# Patient Record
Sex: Female | Born: 1985
Health system: Southern US, Community
[De-identification: ages and names within clinical notes are randomized; demographics above are authoritative.]

---

## 2006-03-30 ENCOUNTER — Emergency Department (HOSPITAL_COMMUNITY): Admission: EM | Admit: 2006-03-30 | Discharge: 2006-03-30 | Payer: Self-pay | Admitting: Emergency Medicine

## 2006-04-05 ENCOUNTER — Emergency Department (HOSPITAL_COMMUNITY): Admission: EM | Admit: 2006-04-05 | Discharge: 2006-04-05 | Payer: Self-pay | Admitting: Emergency Medicine

## 2007-02-21 ENCOUNTER — Other Ambulatory Visit: Admission: RE | Admit: 2007-02-21 | Discharge: 2007-02-21 | Payer: Self-pay | Admitting: Family Medicine

## 2008-05-15 ENCOUNTER — Other Ambulatory Visit: Admission: RE | Admit: 2008-05-15 | Discharge: 2008-05-15 | Payer: Self-pay | Admitting: Obstetrics and Gynecology

## 2008-12-13 ENCOUNTER — Inpatient Hospital Stay (HOSPITAL_COMMUNITY): Admission: AD | Admit: 2008-12-13 | Discharge: 2008-12-15 | Payer: Self-pay | Admitting: Obstetrics and Gynecology

## 2010-01-29 ENCOUNTER — Other Ambulatory Visit
Admission: RE | Admit: 2010-01-29 | Discharge: 2010-01-29 | Payer: Self-pay | Source: Home / Self Care | Admitting: Obstetrics and Gynecology

## 2010-04-13 LAB — COMPREHENSIVE METABOLIC PANEL
ALT: 26 U/L (ref 0–35)
Albumin: 2.9 g/dL — ABNORMAL LOW (ref 3.5–5.2)
BUN: 13 mg/dL (ref 6–23)
CO2: 20 mEq/L (ref 19–32)
Creatinine, Ser: 0.75 mg/dL (ref 0.4–1.2)
Glucose, Bld: 83 mg/dL (ref 70–99)
Total Bilirubin: 0.2 mg/dL — ABNORMAL LOW (ref 0.3–1.2)

## 2010-04-13 LAB — CBC
HCT: 37.9 % (ref 36.0–46.0)
Hemoglobin: 12.5 g/dL (ref 12.0–15.0)
MCV: 85.6 fL (ref 78.0–100.0)
Platelets: 174 10*3/uL (ref 150–400)
RDW: 14.4 % (ref 11.5–15.5)
WBC: 7.5 10*3/uL (ref 4.0–10.5)

## 2010-04-13 LAB — RPR: RPR Ser Ql: NONREACTIVE

## 2011-02-11 ENCOUNTER — Other Ambulatory Visit (HOSPITAL_COMMUNITY)
Admission: RE | Admit: 2011-02-11 | Discharge: 2011-02-11 | Disposition: A | Discharge status: Home / Self Care | Payer: Managed Care, Other (non HMO) | Source: Ambulatory Visit | Attending: Obstetrics and Gynecology | Admitting: Obstetrics and Gynecology

## 2011-02-11 DIAGNOSIS — Z113 Encounter for screening for infections with a predominantly sexual mode of transmission: Secondary | ICD-10-CM | POA: Insufficient documentation

## 2011-02-11 DIAGNOSIS — Z01419 Encounter for gynecological examination (general) (routine) without abnormal findings: Secondary | ICD-10-CM | POA: Insufficient documentation

## 2013-05-21 ENCOUNTER — Other Ambulatory Visit (HOSPITAL_COMMUNITY)
Admission: RE | Admit: 2013-05-21 | Discharge: 2013-05-21 | Disposition: A | Discharge status: Home / Self Care | Payer: BC Managed Care – PPO | Source: Ambulatory Visit | Attending: Nurse Practitioner | Admitting: Nurse Practitioner

## 2013-05-21 ENCOUNTER — Other Ambulatory Visit: Payer: Self-pay | Admitting: Nurse Practitioner

## 2013-05-21 DIAGNOSIS — Z113 Encounter for screening for infections with a predominantly sexual mode of transmission: Secondary | ICD-10-CM | POA: Insufficient documentation

## 2013-05-21 DIAGNOSIS — Z01419 Encounter for gynecological examination (general) (routine) without abnormal findings: Secondary | ICD-10-CM | POA: Insufficient documentation

## 2018-02-26 ENCOUNTER — Other Ambulatory Visit: Payer: Self-pay | Admitting: Obstetrics and Gynecology

## 2018-02-26 ENCOUNTER — Other Ambulatory Visit (HOSPITAL_COMMUNITY)
Admission: RE | Admit: 2018-02-26 | Discharge: 2018-02-26 | Disposition: A | Discharge status: Home / Self Care | Payer: BLUE CROSS/BLUE SHIELD | Source: Ambulatory Visit | Attending: Obstetrics and Gynecology | Admitting: Obstetrics and Gynecology

## 2018-02-26 DIAGNOSIS — Z113 Encounter for screening for infections with a predominantly sexual mode of transmission: Secondary | ICD-10-CM | POA: Diagnosis not present

## 2018-02-26 DIAGNOSIS — Z01419 Encounter for gynecological examination (general) (routine) without abnormal findings: Secondary | ICD-10-CM | POA: Insufficient documentation

## 2018-02-28 LAB — CYTOLOGY - PAP
Chlamydia: NEGATIVE
Diagnosis: NEGATIVE
HPV: NOT DETECTED
NEISSERIA GONORRHEA: NEGATIVE

## 2018-10-04 DIAGNOSIS — Z30432 Encounter for removal of intrauterine contraceptive device: Secondary | ICD-10-CM | POA: Diagnosis not present

## 2019-01-11 NOTE — L&D Delivery Note (Signed)
Delivery Note  [facility] DR Richardson Dopp pt    Patient Name: Gina Williamson DOB: 1985/03/01 MRN: [id number]  Date of admission: 12/06/2019 Delivering MD: Dale [place]  Date of delivery: 12/06/19 Type of delivery: SVD  Newborn Data: Live born female  Birth Weight:   APGAR: 9, 9  Newborn Delivery   Birth date/time: 12/06/2019 08:27:00 Delivery type: Vaginal, Spontaneous     Gina Williamson, 34 y.o., @ 38.4,  [id number], who was admitted for precipitous labor. I was called to the room when she progressed +3 station in the second stage of labor.  She pushed for 1/min.  She delivered a viable infant, cephalic and restituted to the LOA position over an intact perineum.  A nuchal cord   was not identified. The baby was placed on maternal abdomen while initial step of NRP were perfmored (Dry, Stimulated, and warmed). Hat placed on baby for thermoregulation. Delayed cord clamping was performed for 2 minutes.  Cord double clamped and cut.  Cord cut by father. Apgar scores were 9 and 9. Prophylactic Pitocin was started in the third stage of labor for active management. The placenta delivered spontaneously, shultz, with a 3 vessel cord and was sent to LD.  Inspection revealed none. An examination of the vaginal vault and cervix was free from lacerations. The uterus was firm, bleeding stable.   Placenta and umbilical artery blood gas were not sent.  There were no complications during the procedure.  Mom and baby skin to skin following delivery. Left in stable condition.  Maternal Info: Anesthesia: None Episiotomy: NO Lacerations:  No Suture Repair: No Est. Blood Loss (mL):   Newborn Info:  Baby Sex: female Circumcision: N/A  APGAR (1 MIN): 9   APGAR (5 MINS): 9   APGAR (10 MINS):     Mom to postpartum.  Baby to Couplet care / Skin to Skin.  DR Mora Appl updated   [place], CNM, NP-C .12/06/19 12:02 PM

## 2019-04-11 DIAGNOSIS — Z01419 Encounter for gynecological examination (general) (routine) without abnormal findings: Secondary | ICD-10-CM | POA: Diagnosis not present

## 2019-04-24 DIAGNOSIS — O26851 Spotting complicating pregnancy, first trimester: Secondary | ICD-10-CM | POA: Diagnosis not present

## 2019-05-02 DIAGNOSIS — Z3201 Encounter for pregnancy test, result positive: Secondary | ICD-10-CM | POA: Diagnosis not present

## 2019-05-02 DIAGNOSIS — Z3480 Encounter for supervision of other normal pregnancy, unspecified trimester: Secondary | ICD-10-CM | POA: Diagnosis not present

## 2019-05-02 LAB — OB RESULTS CONSOLE RUBELLA ANTIBODY, IGM
Rubella: IMMUNE
Rubella: NON-IMMUNE/NOT IMMUNE

## 2019-05-02 LAB — HEPATITIS C ANTIBODY: HCV Ab: NEGATIVE

## 2019-05-02 LAB — OB RESULTS CONSOLE HIV ANTIBODY (ROUTINE TESTING): HIV: NONREACTIVE

## 2019-05-02 LAB — OB RESULTS CONSOLE HEPATITIS B SURFACE ANTIGEN: Hepatitis B Surface Ag: NEGATIVE

## 2019-05-23 DIAGNOSIS — Z3490 Encounter for supervision of normal pregnancy, unspecified, unspecified trimester: Secondary | ICD-10-CM | POA: Diagnosis not present

## 2019-05-23 DIAGNOSIS — Z3481 Encounter for supervision of other normal pregnancy, first trimester: Secondary | ICD-10-CM | POA: Diagnosis not present

## 2019-05-23 LAB — OB RESULTS CONSOLE GC/CHLAMYDIA
Chlamydia: NEGATIVE
Gonorrhea: NEGATIVE

## 2019-07-18 DIAGNOSIS — Z36 Encounter for antenatal screening for chromosomal anomalies: Secondary | ICD-10-CM | POA: Diagnosis not present

## 2019-09-12 DIAGNOSIS — Z3480 Encounter for supervision of other normal pregnancy, unspecified trimester: Secondary | ICD-10-CM | POA: Diagnosis not present

## 2019-09-12 DIAGNOSIS — Z3482 Encounter for supervision of other normal pregnancy, second trimester: Secondary | ICD-10-CM | POA: Diagnosis not present

## 2019-09-12 LAB — OB RESULTS CONSOLE RPR: RPR: NONREACTIVE

## 2019-09-20 DIAGNOSIS — Z3480 Encounter for supervision of other normal pregnancy, unspecified trimester: Secondary | ICD-10-CM | POA: Diagnosis not present

## 2019-10-17 DIAGNOSIS — Z23 Encounter for immunization: Secondary | ICD-10-CM | POA: Diagnosis not present

## 2019-11-21 DIAGNOSIS — Z3493 Encounter for supervision of normal pregnancy, unspecified, third trimester: Secondary | ICD-10-CM | POA: Diagnosis not present

## 2019-11-29 DIAGNOSIS — R030 Elevated blood-pressure reading, without diagnosis of hypertension: Secondary | ICD-10-CM | POA: Diagnosis not present

## 2019-12-06 ENCOUNTER — Encounter (HOSPITAL_COMMUNITY): Payer: Self-pay | Admitting: *Deleted

## 2019-12-06 ENCOUNTER — Inpatient Hospital Stay (HOSPITAL_COMMUNITY)
Admission: AD | Admit: 2019-12-06 | Discharge: 2019-12-08 | DRG: 807 | Disposition: A | Discharge status: Home / Self Care | Payer: BC Managed Care – PPO | Attending: Obstetrics and Gynecology | Admitting: Obstetrics and Gynecology

## 2019-12-06 DIAGNOSIS — O99824 Streptococcus B carrier state complicating childbirth: Secondary | ICD-10-CM | POA: Diagnosis present

## 2019-12-06 DIAGNOSIS — O134 Gestational [pregnancy-induced] hypertension without significant proteinuria, complicating childbirth: Secondary | ICD-10-CM | POA: Diagnosis not present

## 2019-12-06 DIAGNOSIS — Z3A38 38 weeks gestation of pregnancy: Secondary | ICD-10-CM

## 2019-12-06 DIAGNOSIS — Z23 Encounter for immunization: Secondary | ICD-10-CM | POA: Diagnosis not present

## 2019-12-06 DIAGNOSIS — O26893 Other specified pregnancy related conditions, third trimester: Secondary | ICD-10-CM | POA: Diagnosis not present

## 2019-12-06 DIAGNOSIS — O99214 Obesity complicating childbirth: Secondary | ICD-10-CM | POA: Diagnosis present

## 2019-12-06 DIAGNOSIS — Z20822 Contact with and (suspected) exposure to covid-19: Secondary | ICD-10-CM | POA: Diagnosis present

## 2019-12-06 LAB — TYPE AND SCREEN
ABO/RH(D): O POS
Antibody Screen: NEGATIVE

## 2019-12-06 LAB — CBC
HCT: 39.2 % (ref 36.0–46.0)
Hemoglobin: 12.4 g/dL (ref 12.0–15.0)
MCH: 26 pg (ref 26.0–34.0)
MCHC: 31.6 g/dL (ref 30.0–36.0)
MCV: 82.2 fL (ref 80.0–100.0)
Platelets: 185 10*3/uL (ref 150–400)
RBC: 4.77 MIL/uL (ref 3.87–5.11)
RDW: 14.8 % (ref 11.5–15.5)
WBC: 10.5 10*3/uL (ref 4.0–10.5)
nRBC: 0 % (ref 0.0–0.2)

## 2019-12-06 LAB — RESP PANEL BY RT-PCR (FLU A&B, COVID) ARPGX2
Influenza A by PCR: NEGATIVE
Influenza B by PCR: NEGATIVE
SARS Coronavirus 2 by RT PCR: NEGATIVE

## 2019-12-06 MED ORDER — DIBUCAINE (PERIANAL) 1 % EX OINT: 1.0000 "application " | TOPICAL_OINTMENT | CUTANEOUS | Status: DC | PRN

## 2019-12-06 MED ORDER — DIPHENHYDRAMINE HCL 25 MG PO CAPS: 25.0000 mg | ORAL_CAPSULE | Freq: Four times a day (QID) | ORAL | Status: DC | PRN

## 2019-12-06 MED ORDER — LACTATED RINGERS IV SOLN: 500.0000 mL | INTRAVENOUS | Status: DC | PRN

## 2019-12-06 MED ORDER — OXYCODONE-ACETAMINOPHEN 5-325 MG PO TABS: 2.0000 | ORAL_TABLET | ORAL | Status: DC | PRN

## 2019-12-06 MED ORDER — OXYTOCIN 10 UNIT/ML IJ SOLN
INTRAMUSCULAR | Status: AC
  Filled 2019-12-06: qty 1

## 2019-12-06 MED ORDER — SOD CITRATE-CITRIC ACID 500-334 MG/5ML PO SOLN: 30.0000 mL | ORAL | Status: DC | PRN

## 2019-12-06 MED ORDER — SIMETHICONE 80 MG PO CHEW: 80.0000 mg | CHEWABLE_TABLET | ORAL | Status: DC | PRN

## 2019-12-06 MED ORDER — LIDOCAINE HCL (PF) 1 % IJ SOLN: 30.0000 mL | INTRAMUSCULAR | Status: DC | PRN

## 2019-12-06 MED ORDER — TETANUS-DIPHTH-ACELL PERTUSSIS 5-2.5-18.5 LF-MCG/0.5 IM SUSY: 0.5000 mL | PREFILLED_SYRINGE | Freq: Once | INTRAMUSCULAR | Status: DC

## 2019-12-06 MED ORDER — ONDANSETRON HCL 4 MG/2ML IJ SOLN: 4.0000 mg | Freq: Four times a day (QID) | INTRAMUSCULAR | Status: DC | PRN

## 2019-12-06 MED ORDER — IBUPROFEN 600 MG PO TABS
600.0000 mg | ORAL_TABLET | Freq: Four times a day (QID) | ORAL | Status: DC
  Administered 2019-12-06 – 2019-12-08 (×9): 600 mg via ORAL
  Filled 2019-12-06 (×9): qty 1

## 2019-12-06 MED ORDER — OXYCODONE-ACETAMINOPHEN 5-325 MG PO TABS: 1.0000 | ORAL_TABLET | ORAL | Status: DC | PRN

## 2019-12-06 MED ORDER — OXYTOCIN BOLUS FROM INFUSION: 333.0000 mL | Freq: Once | INTRAVENOUS | Status: DC

## 2019-12-06 MED ORDER — ACETAMINOPHEN 325 MG PO TABS
650.0000 mg | ORAL_TABLET | ORAL | Status: DC | PRN
  Administered 2019-12-07: 650 mg via ORAL
  Filled 2019-12-06: qty 2

## 2019-12-06 MED ORDER — ONDANSETRON HCL 4 MG PO TABS: 4.0000 mg | ORAL_TABLET | ORAL | Status: DC | PRN

## 2019-12-06 MED ORDER — LACTATED RINGERS IV SOLN: INTRAVENOUS | Status: DC

## 2019-12-06 MED ORDER — WITCH HAZEL-GLYCERIN EX PADS: 1.0000 "application " | MEDICATED_PAD | CUTANEOUS | Status: DC | PRN

## 2019-12-06 MED ORDER — SENNOSIDES-DOCUSATE SODIUM 8.6-50 MG PO TABS
2.0000 | ORAL_TABLET | ORAL | Status: DC
  Administered 2019-12-07 (×2): 2 via ORAL
  Filled 2019-12-06 (×2): qty 2

## 2019-12-06 MED ORDER — PRENATAL MULTIVITAMIN CH
1.0000 | ORAL_TABLET | Freq: Every day | ORAL | Status: DC
  Administered 2019-12-06 – 2019-12-08 (×3): 1 via ORAL
  Filled 2019-12-06 (×3): qty 1

## 2019-12-06 MED ORDER — COCONUT OIL OIL: 1.0000 "application " | TOPICAL_OIL | Status: DC | PRN

## 2019-12-06 MED ORDER — OXYTOCIN-SODIUM CHLORIDE 30-0.9 UT/500ML-% IV SOLN: 2.5000 [IU]/h | INTRAVENOUS | Status: DC

## 2019-12-06 MED ORDER — ACETAMINOPHEN 325 MG PO TABS: 650.0000 mg | ORAL_TABLET | ORAL | Status: DC | PRN

## 2019-12-06 MED ORDER — FLEET ENEMA 7-19 GM/118ML RE ENEM: 1.0000 | ENEMA | RECTAL | Status: DC | PRN

## 2019-12-06 MED ORDER — ZOLPIDEM TARTRATE 5 MG PO TABS: 5.0000 mg | ORAL_TABLET | Freq: Every evening | ORAL | Status: DC | PRN

## 2019-12-06 MED ORDER — ONDANSETRON HCL 4 MG/2ML IJ SOLN: 4.0000 mg | INTRAMUSCULAR | Status: DC | PRN

## 2019-12-06 MED ORDER — BENZOCAINE-MENTHOL 20-0.5 % EX AERO
1.0000 "application " | INHALATION_SPRAY | CUTANEOUS | Status: DC | PRN
  Administered 2019-12-06: 1 via TOPICAL
  Filled 2019-12-06: qty 56

## 2019-12-06 NOTE — H&P (Signed)
Gina Williamson is a 34 y.o. female, [facility] pt with Dr [id number], IUP at 38.4 weeks, presenting for Precipitous delivery (CXT <3 hours ago)  now with SROM in car prior to arrival, clear. Pt endorse + Fm. Denies vaginal leakage. Denies vaginal bleeding. H.o vitiligo, and migraines, takes fioricet meds. Entered PNC at 10.4 wg with [facility]. PN Labs: O+, antibody neg,  WNL 3GTT, , gc/c neg, hiv nef, rpr nr, rubella immune, hgb 11.5 at 28 weeks.  Patient Active Problem List   Diagnosis Date Noted  . Normal labor 12/06/2019  . Normal labor and delivery 12/06/2019     No medications prior to admission.    No past medical history on file.   No current facility-administered medications on file prior to encounter.   No current outpatient medications on file prior to encounter.     No Known Allergies  OB History    Gravida  1   Para  1   Term      Preterm      AB      Living  1     SAB      TAB      Ectopic      Multiple  0   Live Births  1          No past medical history on file. History reviewed. No pertinent surgical history. Family History: family history is not on file. Social History:  reports previous alcohol use. She reports previous drug use. No history on file for tobacco use.   Prenatal Transfer Tool  Maternal Diabetes: No Genetic Screening: Normal Maternal Ultrasounds/Referrals: Normal Fetal Ultrasounds or other Referrals:  None Maternal Substance Abuse:  No Significant Maternal Medications:  None Significant Maternal Lab Results: Group B Strep positive  ROS:  Review of Systems  Constitutional: Negative.   HENT: Negative.   Respiratory: Negative.   Cardiovascular: Negative.   Gastrointestinal: Positive for abdominal pain.  Genitourinary: Negative.   Musculoskeletal: Negative.   Skin: Negative.   Neurological: Negative.   Endo/Heme/Allergies: Negative.   Psychiatric/Behavioral: Negative.      Physical Exam: BP 137/82 (BP  Location: Right Arm)   Pulse 89   Temp 99.2 F (37.3 C) (Axillary)   Resp 18   Ht 5\' 6"  (1.676 m)   Wt 98.9 kg   LMP 03/04/2019 (Exact Date) Comment: pregnant  SpO2 98%   Breastfeeding Unknown   BMI 35.19 kg/m   Physical Exam Vitals and nursing note reviewed. Exam conducted with a chaperone present.  Constitutional:      Appearance: Normal appearance.  HENT:     Head: Normocephalic and atraumatic.     Nose: Nose normal.     Mouth/Throat:     Mouth: Mucous membranes are moist.  Eyes:     Pupils: Pupils are equal, round, and reactive to light.  Cardiovascular:     Rate and Rhythm: Normal rate and regular rhythm.     Pulses: Normal pulses.     Heart sounds: Normal heart sounds.  Pulmonary:     Effort: Pulmonary effort is normal.     Breath sounds: Normal breath sounds.  Abdominal:     Palpations: Abdomen is soft.  Genitourinary:    Comments: Vaginal pelvis adequate for delivery Musculoskeletal:        General: Normal range of motion.     Cervical back: Normal range of motion and neck supple.  Skin:    General: Skin is warm and dry.  Capillary Refill: Capillary refill takes less than 2 seconds.  Neurological:     General: No focal deficit present.     Mental Status: She is alert.  Psychiatric:        Mood and Affect: Mood normal.      NST: FHR baseline 135 bpm, Variability: moderate, Accelerations:present, Decelerations:  Present 1 variable noted = Cat 1/Reactive UC:   regular, every 2-3 minutes SVE: 10/100/3+  Dilation: 10 Exam by:: Fabian November CNM, vertex verified by fetal sutures.  Leopold's: Position vertex, EFW 7.5lbs via leopold's.   Labs: Results for orders placed or performed during the hospital encounter of 12/06/19 (from the past 24 hour(s))  Resp Panel by RT-PCR (Flu A&B, Covid) Nasopharyngeal Swab     Status: None   Collection Time: 12/06/19  9:25 AM   Specimen: Nasopharyngeal Swab; Nasopharyngeal(NP) swabs in vial transport medium  Result Value  Ref Range   SARS Coronavirus 2 by RT PCR NEGATIVE NEGATIVE   Influenza A by PCR NEGATIVE NEGATIVE   Influenza B by PCR NEGATIVE NEGATIVE  Type and screen  [facility]     Status: None   Collection Time: 12/06/19 10:45 AM  Result Value Ref Range   ABO/RH(D) O POS    Antibody Screen NEG    Sample Expiration      12/09/2019,2359 Performed at [facility], [address], [place], [place] [address]   CBC     Status: None   Collection Time: 12/06/19 10:53 AM  Result Value Ref Range   WBC 10.5 4.0 - 10.5 K/uL   RBC 4.77 3.87 - 5.11 MIL/uL   Hemoglobin 12.4 12.0 - 15.0 g/dL   HCT 72.6 36 - 46 %   MCV 82.2 80.0 - 100.0 fL   MCH 26.0 26.0 - 34.0 pg   MCHC 31.6 30.0 - 36.0 g/dL   RDW 20.3 55.9 - 74.1 %   Platelets 185 150 - 400 K/uL   nRBC 0.0 0.0 - 0.2 %    Imaging:  No results found.  MAU Course: Orders Placed This Encounter  Procedures  . Resp Panel by RT-PCR (Flu A&B, Covid) Nasopharyngeal Swab  . CBC  . OB RESULTS CONSOLE RPR  . OB RESULTS CONSOLE HIV antibody  . OB RESULTS CONSOLE Rubella Antibody  . OB RESULTS CONSOLE Hepatitis B surface antigen  . Hepatitis C antibody  . OB RESULTS CONSOLE Rubella Antibody  . CBC  . RPR  . OB RESULTS CONSOLE GC/Chlamydia  . Diet regular Room service appropriate? Yes; Fluid consistency: Thin  . Vital signs within 30 minutes of admission to unit, Q1hr x1, Q4hr x 3, then every 8 hours until discharge  . Notify physician (specify)  . Activity as tolerated  . Discontinue epidural catheter at conclusion of delivery and/or repair unless otherwise indicated by provider  . Ice packs to perineum or breast   . Straight catheter  . Sitz bath  . Breast pump, breast shells, coconut oil  . K-pad   . Intake and output  . If RhoGAM is indicated order per protocol.  . May draw maternal lab samples for Cord Blood Donation and/or Placenta Recovery Program  . Initiate Oral Care Protocol  . Initiate Carrier Fluid Protocol  .  Patient has an active order for admit to inpatient/place in observation  . Full code  . Type and screen [facility]  . May convert IV to Saline Lock for antibiotic therapy if tolerating PO fluids well  or to maintain IV site (i.e. for surgery in AM)  . May discontinue IV or Saline Lock if postpartum assessment WNL  . Admit to Inpatient (patient's expected length of stay will be greater than 2 midnights or inpatient only procedure)  . Admit to Inpatient (patient's expected length of stay will be greater than 2 midnights or inpatient only procedure)  . Transfer patient   Meds ordered this encounter  Medications  . DISCONTD: lactated ringers infusion  . DISCONTD: oxytocin (PITOCIN) IV BOLUS FROM BAG  . DISCONTD: oxytocin (PITOCIN) IV infusion 30 units in NS 500 mL - Premix  . DISCONTD: lactated ringers infusion 500-1,000 mL  . DISCONTD: acetaminophen (TYLENOL) tablet 650 mg  . DISCONTD: oxyCODONE-acetaminophen (PERCOCET/ROXICET) 5-325 MG per tablet 1 tablet  . DISCONTD: oxyCODONE-acetaminophen (PERCOCET/ROXICET) 5-325 MG per tablet 2 tablet  . DISCONTD: sodium phosphate (FLEET) 7-19 GM/118ML enema 1 enema  . DISCONTD: ondansetron (ZOFRAN) injection 4 mg  . DISCONTD: sodium citrate-citric acid (ORACIT) solution 30 mL  . DISCONTD: lidocaine (PF) (XYLOCAINE) 1 % injection 30 mL  . DISCONTD: oxytocin (PITOCIN) 10 UNIT/ML injection    Candida Peeling   : cabinet override  . DISCONTD: acetaminophen (TYLENOL) tablet 650 mg  . DISCONTD: oxyCODONE-acetaminophen (PERCOCET/ROXICET) 5-325 MG per tablet 1 tablet  . DISCONTD: oxyCODONE-acetaminophen (PERCOCET/ROXICET) 5-325 MG per tablet 2 tablet  . DISCONTD: ondansetron (ZOFRAN) injection 4 mg  . DISCONTD: sodium citrate-citric acid (ORACIT) solution 30 mL  . DISCONTD: lidocaine (PF) (XYLOCAINE) 1 % injection 30 mL  . ibuprofen (ADVIL) tablet 600 mg  . acetaminophen (TYLENOL) tablet 650 mg  . zolpidem (AMBIEN) tablet 5 mg  .  diphenhydrAMINE (BENADRYL) capsule 25 mg  . senna-docusate (Senokot-S) tablet 2 tablet  . simethicone (MYLICON) chewable tablet 80 mg  . OR Linked Order Group   . ondansetron (ZOFRAN) tablet 4 mg   . ondansetron (ZOFRAN) injection 4 mg  . prenatal multivitamin tablet 1 tablet  . AND Linked Order Group   . witch hazel-glycerin (TUCKS) pad 1 application   . dibucaine (NUPERCAINAL) 1 % rectal ointment 1 application  . benzocaine-Menthol (DERMOPLAST) 20-0.5 % topical spray 1 application  . coconut oil  . Tdap (BOOSTRIX) injection 0.5 mL    Assessment/Plan: Gina Williamson is a 34 y.o. female, [facility] pt with Dr [id number], IUP at 38.4 weeks, presenting for Precipitous delivery (CXT <3 hours ago)  now with SROM in car prior to arrival, clear. Pt endorse + Fm. Denies vaginal leakage. Denies vaginal bleeding. H.o vitiligo, and migraines, takes fioricet meds. Entered PNC at 10.4 wg with [facility]. PN Labs: O+, antibody neg,  WNL 3GTT, , gc/c neg, hiv nef, rpr nr, rubella immune, hgb 11.5 at 28 weeks.  FWB: Cat 1 Fetal Tracing.   Plan: Admit to Birthing Suite per consult with DR Mora Appl Routine [facility] orders Pain med/epidural prn Ampicillin for GBS prophylactic although baby crowning and will not be able to get abx for tx of gbs in. Anticipate SVD now.   [place] NP-C, CNM, MSN 12/06/2019, 12:24 PM

## 2019-12-06 NOTE — Lactation Note (Signed)
This note was copied from a baby's chart. Lactation Consultation Note  Patient Name: Gina Williamson [id number] Date: 12/06/2019 Reason for consult: Initial assessment  Initial visit to 8 hours old of a P2 mother with 12 months of breastfeeding experience. Mother breastfed first child for 8 months and then pump&bottle for 4 months. Infant is with FOB upon arrival and shows hunger cues. Mother states infant latched very well after delivery. Offered to assist with latch. Mother agrees and prefers modified cradle position to right breast. Infant latches at once. Observed good alignment, breast tissue movement, suckling and swallowing. Mother denies discomfort or pain.   Infant unlatched after ~45minutes. Reviewed hand expression, able to collect ~74mL of EBM and spoon-fed to infant. Mother latched infant again with ease.   FOB inquires about using pacifiers. Explained pacifiers can be safely used once breastfeeding has been well established because they can mask hunger cues.   Mother inquires about introducing a bottle with EBM. Talked to parents about pace bottle feeding, upright position and frequent burping. Demonstrated technique.     Plan: 1-Breastfeeding on demand, ensuring a deep, comfortable latch.  2-Offer breast 8-12 times in 24h period to establish good milk supply. 3-Undressing infant and place skin to skin when ready to breastfeed 4-Keep infant awake during breastfeeding session: massaging breast, infant's hand/shoulder/feet 5-Monitor voids and stools as signs good intake.  6-Encouraged maternal rest, hydration and food intake.  7-Contact LC as needed for feeds/support/concerns/questions   All questions answered at this time. Provided Lactation services brochure.    Maternal Data Formula Feeding for Exclusion: No Has patient been taught Hand Expression?: Yes Does the patient have breastfeeding experience prior to this delivery?: Yes  Feeding Feeding Type: Breast Fed  LATCH  Score Latch: Grasps breast easily, tongue down, lips flanged, rhythmical sucking.  Audible Swallowing: Spontaneous and intermittent  Type of Nipple: Everted at rest and after stimulation  Comfort (Breast/Nipple): Soft / non-tender  Hold (Positioning): Assistance needed to correctly position infant at breast and maintain latch.  LATCH Score: 9  Interventions Interventions: Breast feeding basics reviewed;Assisted with latch;Skin to skin;Breast massage;Hand express;Adjust position;Support pillows;Expressed milk  Lactation Tools Discussed/Used WIC Program: No   Consult Status Consult Status: Follow-up Date: 12/07/19 Follow-up type: In-patient    Isaly Fasching A Higuera Ancidey 12/06/2019, 5:07 PM

## 2019-12-06 NOTE — MAU Note (Signed)
Pt assisted from car with strong contractions stated she is feeling baby head. M.Bhambri,CNM at attendance. Placed in room. FHR 122 SVE 10/100/0. Called L&D and room assginment given. Dr Mora Appl and J. montannaCNM called.

## 2019-12-06 NOTE — Plan of Care (Signed)
Mckinnley Smithey RN  

## 2019-12-07 ENCOUNTER — Encounter (HOSPITAL_COMMUNITY): Payer: Self-pay | Admitting: Obstetrics and Gynecology

## 2019-12-07 LAB — CBC
HCT: 35.5 % — ABNORMAL LOW (ref 36.0–46.0)
Hemoglobin: 11.2 g/dL — ABNORMAL LOW (ref 12.0–15.0)
MCH: 26.4 pg (ref 26.0–34.0)
MCHC: 31.5 g/dL (ref 30.0–36.0)
MCV: 83.5 fL (ref 80.0–100.0)
Platelets: 155 10*3/uL (ref 150–400)
RBC: 4.25 MIL/uL (ref 3.87–5.11)
RDW: 15.1 % (ref 11.5–15.5)
WBC: 7.9 10*3/uL (ref 4.0–10.5)
nRBC: 0 % (ref 0.0–0.2)

## 2019-12-07 LAB — RPR: RPR Ser Ql: NONREACTIVE

## 2019-12-07 NOTE — Lactation Note (Signed)
This note was copied from a baby's chart. Lactation Consultation Note  Patient Name: Gina Williamson Date: 12/07/2019 Reason for consult: Follow-up assessment   LC  attempt to see mother. Both mother and father sleeping.    Maternal Data    Feeding    LATCH Score                   Interventions    Lactation Tools Discussed/Used     Consult Status Consult Status: Follow-up Date: 12/07/19 Follow-up type: In-patient    Stevan Born [facility] 12/07/2019, 1:34 PM

## 2019-12-07 NOTE — Progress Notes (Signed)
Postpartum Note Day #1 SVD  S:  Patient doing well.  Pain controlled.  Tolerating regular diet.   Ambulating and voiding without difficulty.   Denies fevers, chills, chest pain, SOB, N/V, or worsening bilateral LE edema.  Lochia: Minimal Infant feeding:  Breast Circumcision:  NA, female infant Contraception:  Kyleena IUD at 6 week PPV  O: Temp:  [98.4 F (36.9 C)-99.2 F (37.3 C)] 98.4 F (36.9 C) (11/27 0030) Pulse Rate:  [75-89] 83 (11/27 0030) Resp:  [16-18] 16 (11/27 0030) BP: (130-139)/(82-92) 131/90 (11/27 0030) SpO2:  [98 %-100 %] 99 % (11/26 2030) Weight:  [98.9 kg] 98.9 kg (11/26 0917) Gen: NAD, pleasant and cooperative CV: RRR Resp: CTAB, no wheezes/rales/rhonchi Abdomen: soft, non-distended, non-tender throughout Uterus: firm, non-tender, below umbilicus Ext: no bilateral LE edema, no bilateral calf tenderness  Labs: Recent Labs    12/06/19 1053 12/07/19 0443  HGB 12.4 11.2*    A/P: Patient is a 34 y.o. G1P1 PPD#1 s/p SVD.  - Pain well controlled  - GU: UOP is adequate - GI: Tolerating regular diet - Activity: encouraged sitting up to chair and ambulation as tolerated - DVT Prophylaxis: SCDs - Labs: stable as above  Disposition:  PPD#2   Steva Ready, DO [phone] (office)

## 2019-12-07 NOTE — Lactation Note (Signed)
This note was copied from a baby's chart. Lactation Consultation Note  Patient Name: Gina Williamson Date: 12/07/2019 Reason for consult: Follow-up assessment   Mother reports that breastfeeding is going good. She reports that she has learned so much from the staff and the LC's. She reports that she knows when the infant has a good latch.  Mother reports that she takes the infant off if she latches and feels pain.   Discussed tips to get infant to take a bottle. She reports that her older daughter would take a bottle when she left her,. So she is going to try different things.  Mother advised to continue to cue base feed and allow for cluster feeding. Discussed milk transition.     Maternal Data    Feeding Feeding Type: Breast Fed  LATCH Score                   Interventions    Lactation Tools Discussed/Used     Consult Status Consult Status: Follow-up Date: 12/08/19 Follow-up type: In-patient    Stevan Born [facility] 12/07/2019, 2:17 PM

## 2019-12-08 LAB — COMPREHENSIVE METABOLIC PANEL
ALT: 24 U/L (ref 0–44)
AST: 20 U/L (ref 15–41)
Albumin: 2.5 g/dL — ABNORMAL LOW (ref 3.5–5.0)
Alkaline Phosphatase: 85 U/L (ref 38–126)
Anion gap: 11 (ref 5–15)
BUN: 13 mg/dL (ref 6–20)
CO2: 22 mmol/L (ref 22–32)
Calcium: 8.7 mg/dL — ABNORMAL LOW (ref 8.9–10.3)
Chloride: 104 mmol/L (ref 98–111)
Creatinine, Ser: 0.83 mg/dL (ref 0.44–1.00)
GFR, Estimated: >60 mL/min (ref >60)
Glucose, Bld: 81 mg/dL (ref 70–99)
Potassium: 3.9 mmol/L (ref 3.5–5.1)
Sodium: 137 mmol/L (ref 135–145)
Total Bilirubin: 0.6 mg/dL (ref 0.3–1.2)
Total Protein: 6.3 g/dL — ABNORMAL LOW (ref 6.5–8.1)

## 2019-12-08 LAB — CBC
HCT: 35.3 % — ABNORMAL LOW (ref 36.0–46.0)
Hemoglobin: 11.4 g/dL — ABNORMAL LOW (ref 12.0–15.0)
MCH: 26.5 pg (ref 26.0–34.0)
MCHC: 32.3 g/dL (ref 30.0–36.0)
MCV: 82.1 fL (ref 80.0–100.0)
Platelets: 183 10*3/uL (ref 150–400)
RBC: 4.3 MIL/uL (ref 3.87–5.11)
RDW: 15.2 % (ref 11.5–15.5)
WBC: 6 10*3/uL (ref 4.0–10.5)
nRBC: 0 % (ref 0.0–0.2)

## 2019-12-08 LAB — PROTEIN / CREATININE RATIO, URINE
Creatinine, Urine: 160.51 mg/dL
Protein Creatinine Ratio: 0.19 mg/mg{Cre} — ABNORMAL HIGH (ref 0.00–0.15)
Total Protein, Urine: 31 mg/dL

## 2019-12-08 LAB — LACTATE DEHYDROGENASE: LDH: 209 U/L — ABNORMAL HIGH (ref 98–192)

## 2019-12-08 MED ORDER — IBUPROFEN 800 MG PO TABS: 800.0000 mg | ORAL_TABLET | Freq: Three times a day (TID) | ORAL | 0 refills | Status: AC | PRN

## 2019-12-08 NOTE — Discharge Summary (Signed)
Postpartum Discharge Summary  12/08/19     Patient Name: Gina Williamson DOB: 11-03-1985 MRN: [id number]  Date of admission: 12/06/2019 Delivery date:12/06/2019  Delivering provider: Noralyn Pick  Date of discharge: 12/08/2019  Admitting diagnosis: Normal labor [O80, Z37.9] Normal labor and delivery [O80] Intrauterine pregnancy: [redacted]w[redacted]d    Secondary diagnosis:  Active Problems:   Normal labor   Normal labor and delivery  Additional problems: Migraines, Obesity (BMI 35), Vitiligo   Discharge diagnosis: Term Pregnancy Delivered and Gestational Hypertension                                              Post partum procedures:None Augmentation: None Complications: None  Hospital course: Onset of Labor With Vaginal Delivery      34y.o. yo [id number] [place] Patient had an uncomplicated labor course as follows:  Membrane Rupture Time/Date: 8:05 AM ,12/06/2019   Delivery Method:Vaginal, Spontaneous  Episiotomy: None  Lacerations:  None  Patient had an uncomplicated postpartum course.  She is ambulating, tolerating a regular diet, passing flatus, and urinating well. Patient is discharged home in stable condition on 12/08/19.  Newborn Data: Birth date:12/06/2019  Birth time:8:27 AM  Gender:Female  Living status:Living  Apgars:9 ,9  Weight:3065 g   Magnesium Sulfate received: No BMZ received: No Rhophylac:No MMR:No T-DaP:Given prenatally Flu: Unknown Transfusion:No  Physical exam  Vitals:   12/07/19 2015 12/07/19 2200 12/08/19 0600 12/08/19 0612  BP: (!) 143/97 (!) 145/88 (!) 138/92 (!) 139/95  Pulse: 77 81 77 73  Resp: 17  18   Temp: 98.1 F (36.7 C)  97.9 F (36.6 C)   TempSrc: Oral  Axillary   SpO2: 99%  99%   Weight:      Height:       General: alert, cooperative and no distress Cardio:  RRR Lungs:  CTAB, no wheezes/rales/rhonchi Lochia: appropriate Uterine Fundus: firm Incision: N/A DVT Evaluation:  No evidence of DVT seen on physical exam. No cords or calf tenderness. Labs: Lab Results  Component Value Date   WBC 7.9 12/07/2019   HGB 11.2 (L) 12/07/2019   HCT 35.5 (L) 12/07/2019   MCV 83.5 12/07/2019   PLT 155 12/07/2019   CMP Latest Ref Rng & Units 12/13/2008  Glucose 70 - 99 mg/dL 83  BUN 6 - 23 mg/dL 13  Creatinine 0.40 - 1.20 mg/dL 0.75  Sodium 135 - 145 mEq/L 133(L)  Potassium 3.5 - 5.1 mEq/L 4.1  Chloride 96 - 112 mEq/L 104  CO2 19 - 32 mEq/L 20  Calcium 8.4 - 10.5 mg/dL 9.3  Total Protein 6.0 - 8.3 g/dL 7.1  Total Bilirubin 0.3 - 1.2 mg/dL 0.2(L)  Alkaline Phos 39 - 117 U/L 104  AST 0 - 37 U/L 20  ALT 0 - 35 U/L 26   Edinburgh Score: Edinburgh Postnatal Depression Scale Screening Tool 12/06/2019  I have been able to laugh and see the funny side of things. 0  I have looked forward with enjoyment to things. 0  I have blamed myself unnecessarily when things went wrong. 1  I have been anxious or worried for no good reason. 0  I have felt scared or panicky for no good reason. 1  Things have been getting on top of me. 0  I have been so unhappy that I have had  difficulty sleeping. 0  I have felt sad or miserable. 0  I have been so unhappy that I have been crying. 0  The thought of harming myself has occurred to me. 0  Edinburgh Postnatal Depression Scale Total 2      After visit meds:  Allergies as of 12/08/2019   No Known Allergies     Medication List    TAKE these medications   ibuprofen 800 MG tablet Commonly known as: ADVIL Take 1 tablet (800 mg total) by mouth every 8 (eight) hours as needed.        Discharge home in stable condition Infant Feeding: Breast Infant Disposition:home with mother Discharge instruction: per After Visit Summary and Postpartum booklet. Activity: Advance as tolerated. Pelvic rest for 6 weeks.  Diet: routine diet Anticipated Birth Control: IUD Postpartum Appointment:1 week BP check and 6 week PPV with IUD  insertion Additional Postpartum F/U: BP check 1 week Future Appointments:No future appointments. Follow up Visit:  Follow-up Information    Christophe Louis, MD Follow up in 6 week(s).   Specialty: Obstetrics and Gynecology Contact information: [address] [place] [address] [phone]                  12/08/2019 Drema Dallas, DO

## 2019-12-08 NOTE — Lactation Note (Signed)
This note was copied from a baby's chart. Lactation Consultation Note  Patient Name: Gina Williamson [id number] Date: 12/08/2019 Reason for consult: Follow-up assessment   Mother reports that she has no pain when breastfeeding. She reports that her milk is coming in and she is leaking. She ask for nursing pads.   Discussed treatment and prevention of engorgement.   Mother advised to continue to cue base feed. Mother encouraged to allow for more cluster feeding .  Mother to continue to cue base feed infant and feed at least 8-12 times or more in 24 hours and advised to allow for cluster feeding infant as needed.  Mother to continue to due STS. Mother is aware of available LC services at [facility], BFSG'S, OP Dept, and phone # for questions or concerns about breastfeeding.  Mother receptive to all teaching and plan of care.     Maternal Data    Feeding Feeding Type: Breast Fed  LATCH Score Latch: Grasps breast easily, tongue down, lips flanged, rhythmical sucking.  Audible Swallowing: Spontaneous and intermittent  Type of Nipple: Everted at rest and after stimulation  Comfort (Breast/Nipple): Filling, red/small blisters or bruises, mild/mod discomfort  Hold (Positioning): No assistance needed to correctly position infant at breast.  LATCH Score: 9  Interventions    Lactation Tools Discussed/Used     Consult Status Consult Status: Complete    Michel Bickers 12/08/2019, 11:34 AM

## 2020-01-21 DIAGNOSIS — Z8742 Personal history of other diseases of the female genital tract: Secondary | ICD-10-CM | POA: Diagnosis not present

## 2020-01-21 DIAGNOSIS — Z30430 Encounter for insertion of intrauterine contraceptive device: Secondary | ICD-10-CM | POA: Diagnosis not present

## 2020-05-21 DIAGNOSIS — Z20822 Contact with and (suspected) exposure to covid-19: Secondary | ICD-10-CM | POA: Diagnosis not present

## 2020-07-15 DIAGNOSIS — N898 Other specified noninflammatory disorders of vagina: Secondary | ICD-10-CM | POA: Diagnosis not present

## 2020-07-15 DIAGNOSIS — Z01419 Encounter for gynecological examination (general) (routine) without abnormal findings: Secondary | ICD-10-CM | POA: Diagnosis not present

## 2020-12-07 ENCOUNTER — Emergency Department (HOSPITAL_COMMUNITY): Payer: BC Managed Care – PPO

## 2020-12-07 ENCOUNTER — Emergency Department (HOSPITAL_COMMUNITY)
Admission: EM | Admit: 2020-12-07 | Discharge: 2020-12-07 | Disposition: A | Discharge status: Home / Self Care | Payer: BC Managed Care – PPO | Attending: Emergency Medicine | Admitting: Emergency Medicine

## 2020-12-07 ENCOUNTER — Other Ambulatory Visit: Payer: Self-pay

## 2020-12-07 DIAGNOSIS — W010XXA Fall on same level from slipping, tripping and stumbling without subsequent striking against object, initial encounter: Secondary | ICD-10-CM | POA: Insufficient documentation

## 2020-12-07 DIAGNOSIS — M25572 Pain in left ankle and joints of left foot: Secondary | ICD-10-CM | POA: Insufficient documentation

## 2020-12-07 DIAGNOSIS — S9302XA Subluxation of left ankle joint, initial encounter: Secondary | ICD-10-CM | POA: Diagnosis not present

## 2020-12-07 DIAGNOSIS — S82832A Other fracture of upper and lower end of left fibula, initial encounter for closed fracture: Secondary | ICD-10-CM | POA: Diagnosis not present

## 2020-12-07 DIAGNOSIS — S99912A Unspecified injury of left ankle, initial encounter: Secondary | ICD-10-CM | POA: Diagnosis not present

## 2020-12-07 DIAGNOSIS — S82899A Other fracture of unspecified lower leg, initial encounter for closed fracture: Secondary | ICD-10-CM

## 2020-12-07 DIAGNOSIS — Y9301 Activity, walking, marching and hiking: Secondary | ICD-10-CM | POA: Diagnosis not present

## 2020-12-07 DIAGNOSIS — R609 Edema, unspecified: Secondary | ICD-10-CM | POA: Diagnosis not present

## 2020-12-07 DIAGNOSIS — I10 Essential (primary) hypertension: Secondary | ICD-10-CM | POA: Diagnosis not present

## 2020-12-07 DIAGNOSIS — S82402A Unspecified fracture of shaft of left fibula, initial encounter for closed fracture: Secondary | ICD-10-CM | POA: Diagnosis not present

## 2020-12-07 MED ORDER — OXYCODONE-ACETAMINOPHEN 5-325 MG PO TABS: 1.0000 | ORAL_TABLET | Freq: Four times a day (QID) | ORAL | 0 refills | Status: AC | PRN

## 2020-12-07 MED ORDER — OXYCODONE-ACETAMINOPHEN 5-325 MG PO TABS
1.0000 | ORAL_TABLET | Freq: Once | ORAL | Status: AC
  Administered 2020-12-07: 05:00:00 1 via ORAL
  Filled 2020-12-07: qty 1

## 2020-12-07 NOTE — ED Provider Notes (Signed)
[facility]-EMERGENCY DEPT Provider Note   CSN: [id number] Arrival date & time: 12/07/20  0403     History Chief Complaint  Patient presents with   Ankle Pain    Gina Williamson is a 35 y.o. female.  35 year old female presents to the emergency department for evaluation of left ankle pain.  She reports that she was walking in the dark when she tripped over her child's unicorn toy.  Had sudden onset of pain in her left ankle which has remained constant, unchanged.  Unable to weight-bear due to discomfort.  No medications taken or received prior to arrival/in transport.  She denies any numbness, paresthesias.  Is presently breast-feeding.  The history is provided by the patient. No language interpreter was used.  Ankle Pain     No past medical history on file.  Patient Active Problem List   Diagnosis Date Noted   Normal labor 12/06/2019   Normal labor and delivery 12/06/2019    No past surgical history on file.   OB History     Gravida  4   Para  2   Term  0   Preterm  0   AB  2   Living  1      SAB      IAB      Ectopic      Multiple  0   Live Births  1           No family history on file.  Social History   Vaping Use   Vaping Use: Never used  Substance Use Topics   Alcohol use: Not Currently   Drug use: Not Currently    Home Medications Prior to Admission medications   Medication Sig Start Date End Date Taking? Authorizing Provider  oxyCODONE-acetaminophen (PERCOCET/ROXICET) 5-325 MG tablet Take 1 tablet by mouth every 6 (six) hours as needed for severe pain. 12/07/20  Yes Antony Madura, PA-C  ibuprofen (ADVIL) 800 MG tablet Take 1 tablet (800 mg total) by mouth every 8 (eight) hours as needed. 12/08/19   Steva Ready, DO    Allergies    Patient has no known allergies.  Review of Systems   Review of Systems Ten systems reviewed and are negative for acute change, except as noted in the HPI.    Physical  Exam Updated Vital Signs BP 134/90   Pulse 79   Temp 97.8 F (36.6 C) (Oral)   Resp 20   Ht 5\' 6"  (1.676 m)   Wt 88.5 kg   SpO2 100%   BMI 31.47 kg/m   Physical Exam Vitals and nursing note reviewed.  Constitutional:      General: She is not in acute distress.    Appearance: She is well-developed. She is not diaphoretic.     Comments: Nontoxic appearing and in NAD  HENT:     Head: Normocephalic and atraumatic.  Eyes:     General: No scleral icterus.    Conjunctiva/sclera: Conjunctivae normal.  Cardiovascular:     Comments: 2+ DP pulse in the LLE. Capillary refill brisk in all digits of the L foot. Pulmonary:     Effort: Pulmonary effort is normal. No respiratory distress.     Comments: Respirations even and unlabored Musculoskeletal:        General: Swelling, tenderness and signs of injury present.     Cervical back: Normal range of motion.     Comments: Swelling of the L ankle without effusion or crepitus. TTP of  the lateral left ankle. Negative Thompson test.  Skin:    General: Skin is warm and dry.     Coloration: Skin is not pale.     Findings: No erythema or rash.  Neurological:     Mental Status: She is alert and oriented to person, place, and time.     Comments: Sensation intact in all digits of the left foot.  Able to wiggle all toes.  Psychiatric:        Behavior: Behavior normal.    ED Results / Procedures / Treatments   Labs (all labs ordered are listed, but only abnormal results are displayed) Labs Reviewed - No data to display  EKG None  Radiology DG Ankle 2 Views Left  Result Date: 12/07/2020 CLINICAL DATA:  Posttraumatic ankle pain EXAM: LEFT ANKLE - 2 VIEW COMPARISON:  None. FINDINGS: Distal fibular fracture with anterior displacement. Soft tissue injury at the ankle with posterior subluxation of the ankle joint and medial clear space widening. IMPRESSION: Disrupted ankle joint with subluxation and displaced distal fibular fracture.  Electronically Signed   By: Tiburcio Pea M.D.   On: 12/07/2020 04:55    Procedures Procedures   Medications Ordered in ED Medications  oxyCODONE-acetaminophen (PERCOCET/ROXICET) 5-325 MG per tablet 1 tablet (1 tablet Oral Given 12/07/20 9470)    ED Course  I have reviewed the triage vital signs and the nursing notes.  Pertinent labs & imaging results that were available during my care of the patient were reviewed by me and considered in my medical decision making (see chart for details).  Clinical Course as of 12/07/20 9628  [facility] Dec 07, 2020  3662 Neurovascularly intact post splinting. [KH]  [phone] Improved talar tibial alignment on repeat x-ray. [KH]    Clinical Course User Index [KH] Darylene Price   MDM Rules/Calculators/A&P                           35 year old female presenting to the emergency department after tripping on a unicorn toy.  Noted to be neurovascularly intact, but with evidence of distal fibular fracture on x-ray.  Also suspect ligamentous injury given talotibial subluxation.  Patient had improved alignment with splinting.  Advised to remain nonweightbearing and given crutches.  Will refer to orthopedics to ensure proper healing.  Given short course of Percocet for pain control.  Return precautions discussed and provided. Patient discharged in stable condition with no unaddressed concerns.   Final Clinical Impression(s) / ED Diagnoses Final diagnoses:  Closed fracture of distal end of left fibula, unspecified fracture morphology, initial encounter  Subluxation of left ankle joint, initial encounter  Ankle fracture    Rx / DC Orders ED Discharge Orders          Ordered    oxyCODONE-acetaminophen (PERCOCET/ROXICET) 5-325 MG tablet  Every 6 hours PRN        12/07/20 0518             Antony Madura, PA-C 12/07/20 5465    Marily Memos, MD 12/07/20 [phone]

## 2020-12-07 NOTE — ED Triage Notes (Signed)
Pt BIB EMS from home, pt fell over a unicorn toy 30 mins ago. Pt reports with pain to her left ankle.

## 2020-12-07 NOTE — Discharge Instructions (Addendum)
Keep your splint on at all times for stability.  Do not remove it or get it wet.  You have been given crutches to assist in your mobility.  Do not put weight on your left foot/ankle.  Call the office of Dr. Ave Filter in the morning to schedule a visit to be seen in the office to ensure proper healing of your broken bone.  You may take Percocet as prescribed for management of pain.  Do not drive or drink alcohol after taking this medication as it may make you drowsy and impair your judgment.  Do not take Percocet with Tylenol as there is already Tylenol in this medication.  Return to the ED for new or concerning symptoms.

## 2020-12-07 NOTE — Progress Notes (Signed)
Orthopedic Tech Progress Note Patient Details:  CARISSA MUSICK May 07, 1985 [id number]  Ortho Devices Type of Ortho Device: Post (short leg) splint, Stirrup splint, Crutches Ortho Device/Splint Location: lle Ortho Device/Splint Interventions: Ordered, Application, Adjustment   Post Interventions Patient Tolerated: Well Instructions Provided: Care of device, Adjustment of device  Trinna Post 12/07/2020, 6:20 AM

## 2020-12-11 DIAGNOSIS — M25572 Pain in left ankle and joints of left foot: Secondary | ICD-10-CM | POA: Diagnosis not present

## 2020-12-17 DIAGNOSIS — X58XXXA Exposure to other specified factors, initial encounter: Secondary | ICD-10-CM | POA: Diagnosis not present

## 2020-12-17 DIAGNOSIS — S82832A Other fracture of upper and lower end of left fibula, initial encounter for closed fracture: Secondary | ICD-10-CM | POA: Diagnosis not present

## 2020-12-17 DIAGNOSIS — G8918 Other acute postprocedural pain: Secondary | ICD-10-CM | POA: Diagnosis not present

## 2020-12-17 DIAGNOSIS — S8262XA Displaced fracture of lateral malleolus of left fibula, initial encounter for closed fracture: Secondary | ICD-10-CM | POA: Diagnosis not present

## 2020-12-17 DIAGNOSIS — S9302XA Subluxation of left ankle joint, initial encounter: Secondary | ICD-10-CM | POA: Diagnosis not present

## 2020-12-17 DIAGNOSIS — W228XXA Striking against or struck by other objects, initial encounter: Secondary | ICD-10-CM | POA: Diagnosis not present

## 2020-12-17 DIAGNOSIS — S93432A Sprain of tibiofibular ligament of left ankle, initial encounter: Secondary | ICD-10-CM | POA: Diagnosis not present

## 2021-01-01 DIAGNOSIS — M25572 Pain in left ankle and joints of left foot: Secondary | ICD-10-CM | POA: Diagnosis not present

## 2021-01-01 DIAGNOSIS — Z9889 Other specified postprocedural states: Secondary | ICD-10-CM | POA: Diagnosis not present

## 2021-01-25 DIAGNOSIS — M25572 Pain in left ankle and joints of left foot: Secondary | ICD-10-CM | POA: Diagnosis not present

## 2021-01-25 DIAGNOSIS — Z9889 Other specified postprocedural states: Secondary | ICD-10-CM | POA: Diagnosis not present

## 2021-02-05 DIAGNOSIS — S8265XD Nondisplaced fracture of lateral malleolus of left fibula, subsequent encounter for closed fracture with routine healing: Secondary | ICD-10-CM | POA: Diagnosis not present

## 2021-02-05 DIAGNOSIS — M25672 Stiffness of left ankle, not elsewhere classified: Secondary | ICD-10-CM | POA: Diagnosis not present

## 2021-02-09 DIAGNOSIS — M25672 Stiffness of left ankle, not elsewhere classified: Secondary | ICD-10-CM | POA: Diagnosis not present

## 2021-02-09 DIAGNOSIS — S8265XD Nondisplaced fracture of lateral malleolus of left fibula, subsequent encounter for closed fracture with routine healing: Secondary | ICD-10-CM | POA: Diagnosis not present

## 2021-02-12 DIAGNOSIS — M25672 Stiffness of left ankle, not elsewhere classified: Secondary | ICD-10-CM | POA: Diagnosis not present

## 2021-02-12 DIAGNOSIS — S8265XD Nondisplaced fracture of lateral malleolus of left fibula, subsequent encounter for closed fracture with routine healing: Secondary | ICD-10-CM | POA: Diagnosis not present

## 2021-02-15 DIAGNOSIS — S8265XD Nondisplaced fracture of lateral malleolus of left fibula, subsequent encounter for closed fracture with routine healing: Secondary | ICD-10-CM | POA: Diagnosis not present

## 2021-02-15 DIAGNOSIS — M25672 Stiffness of left ankle, not elsewhere classified: Secondary | ICD-10-CM | POA: Diagnosis not present

## 2021-02-17 DIAGNOSIS — M25672 Stiffness of left ankle, not elsewhere classified: Secondary | ICD-10-CM | POA: Diagnosis not present

## 2021-02-17 DIAGNOSIS — S8265XD Nondisplaced fracture of lateral malleolus of left fibula, subsequent encounter for closed fracture with routine healing: Secondary | ICD-10-CM | POA: Diagnosis not present

## 2021-02-23 DIAGNOSIS — S8265XD Nondisplaced fracture of lateral malleolus of left fibula, subsequent encounter for closed fracture with routine healing: Secondary | ICD-10-CM | POA: Diagnosis not present

## 2021-02-23 DIAGNOSIS — M25672 Stiffness of left ankle, not elsewhere classified: Secondary | ICD-10-CM | POA: Diagnosis not present

## 2021-02-25 DIAGNOSIS — S8265XD Nondisplaced fracture of lateral malleolus of left fibula, subsequent encounter for closed fracture with routine healing: Secondary | ICD-10-CM | POA: Diagnosis not present

## 2021-02-25 DIAGNOSIS — M25672 Stiffness of left ankle, not elsewhere classified: Secondary | ICD-10-CM | POA: Diagnosis not present

## 2021-03-01 DIAGNOSIS — M25672 Stiffness of left ankle, not elsewhere classified: Secondary | ICD-10-CM | POA: Diagnosis not present

## 2021-03-01 DIAGNOSIS — S8265XD Nondisplaced fracture of lateral malleolus of left fibula, subsequent encounter for closed fracture with routine healing: Secondary | ICD-10-CM | POA: Diagnosis not present

## 2021-03-04 DIAGNOSIS — M25672 Stiffness of left ankle, not elsewhere classified: Secondary | ICD-10-CM | POA: Diagnosis not present

## 2021-03-04 DIAGNOSIS — S8265XD Nondisplaced fracture of lateral malleolus of left fibula, subsequent encounter for closed fracture with routine healing: Secondary | ICD-10-CM | POA: Diagnosis not present

## 2021-03-08 DIAGNOSIS — Z9889 Other specified postprocedural states: Secondary | ICD-10-CM | POA: Diagnosis not present

## 2021-03-08 DIAGNOSIS — M25672 Stiffness of left ankle, not elsewhere classified: Secondary | ICD-10-CM | POA: Diagnosis not present

## 2021-04-07 DIAGNOSIS — M25672 Stiffness of left ankle, not elsewhere classified: Secondary | ICD-10-CM | POA: Diagnosis not present

## 2021-07-23 DIAGNOSIS — N898 Other specified noninflammatory disorders of vagina: Secondary | ICD-10-CM | POA: Diagnosis not present

## 2021-07-23 DIAGNOSIS — Z124 Encounter for screening for malignant neoplasm of cervix: Secondary | ICD-10-CM | POA: Diagnosis not present

## 2021-07-23 DIAGNOSIS — Z1151 Encounter for screening for human papillomavirus (HPV): Secondary | ICD-10-CM | POA: Diagnosis not present

## 2021-07-23 DIAGNOSIS — Z01419 Encounter for gynecological examination (general) (routine) without abnormal findings: Secondary | ICD-10-CM | POA: Diagnosis not present

## 2022-06-10 IMAGING — DX DG ANKLE PORT 2V*L*
3 series · 3 of 3 positions shown · non-contrast
Comparison: None.

CLINICAL DATA: Ankle fracture

EXAM:
PORTABLE LEFT ANKLE - 2 VIEW

[ankle ap]
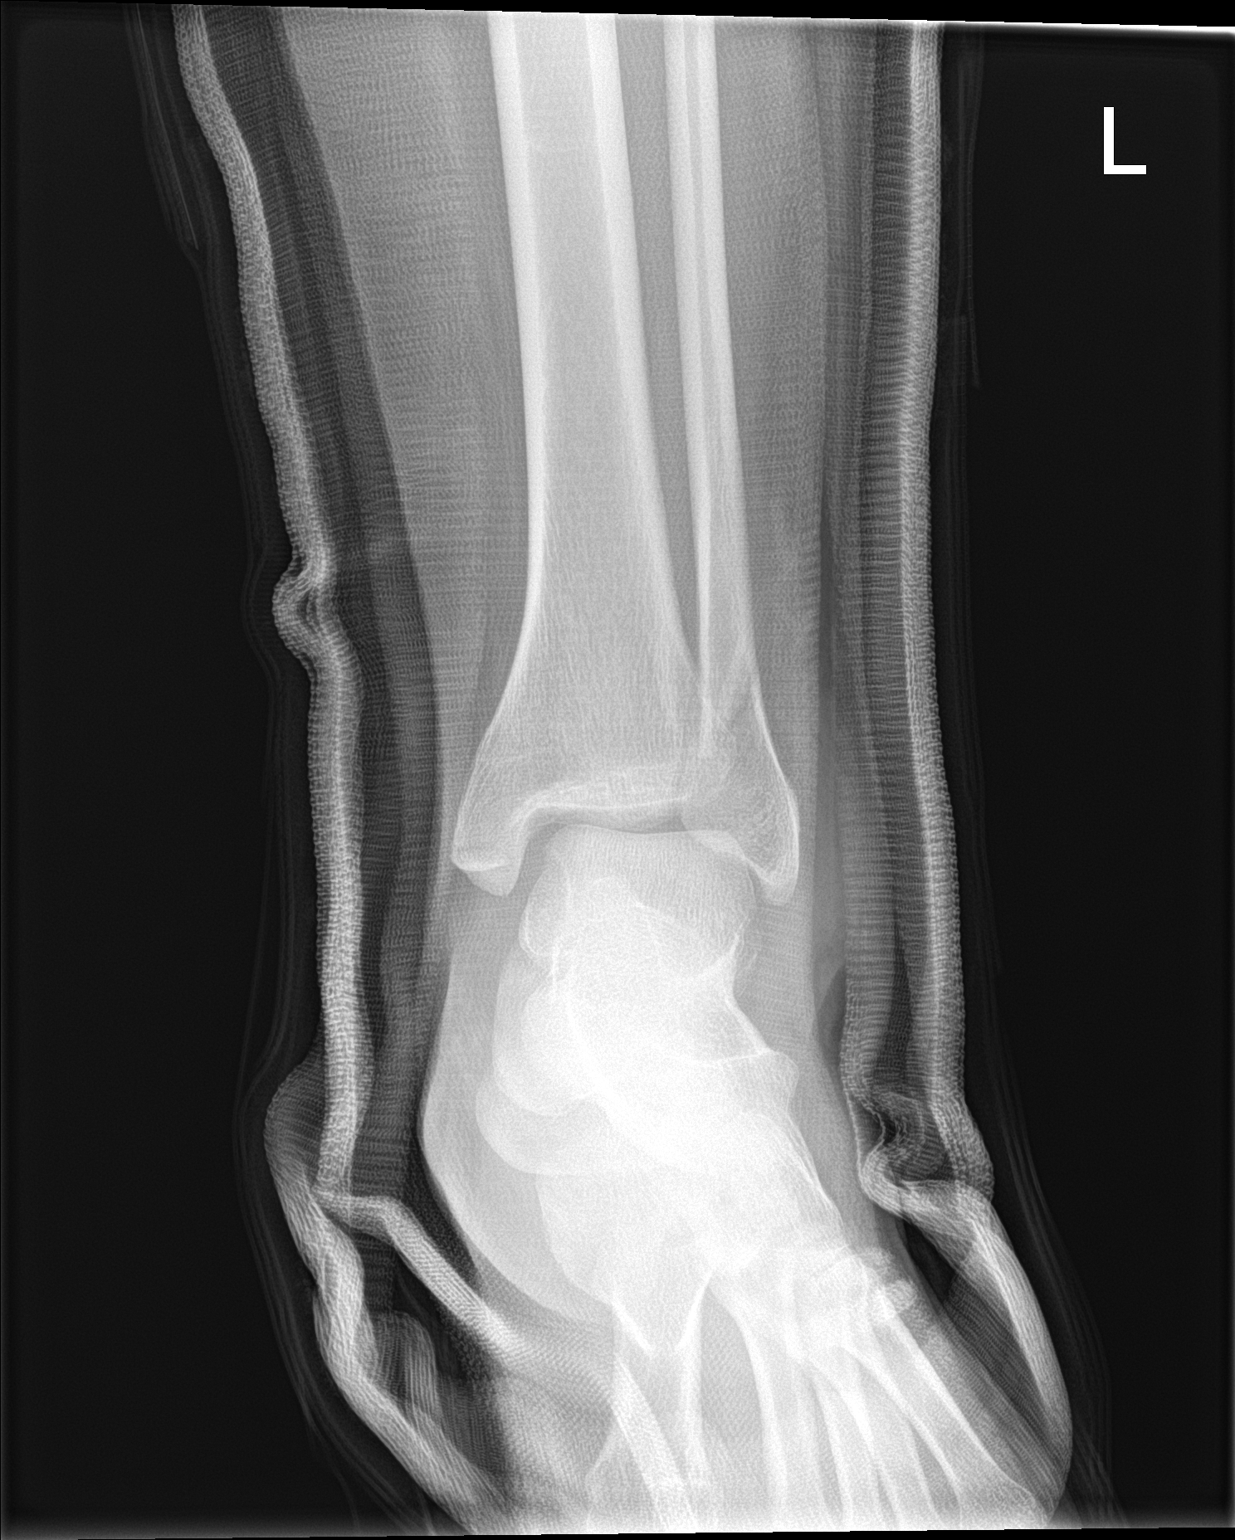

[ankle obl]
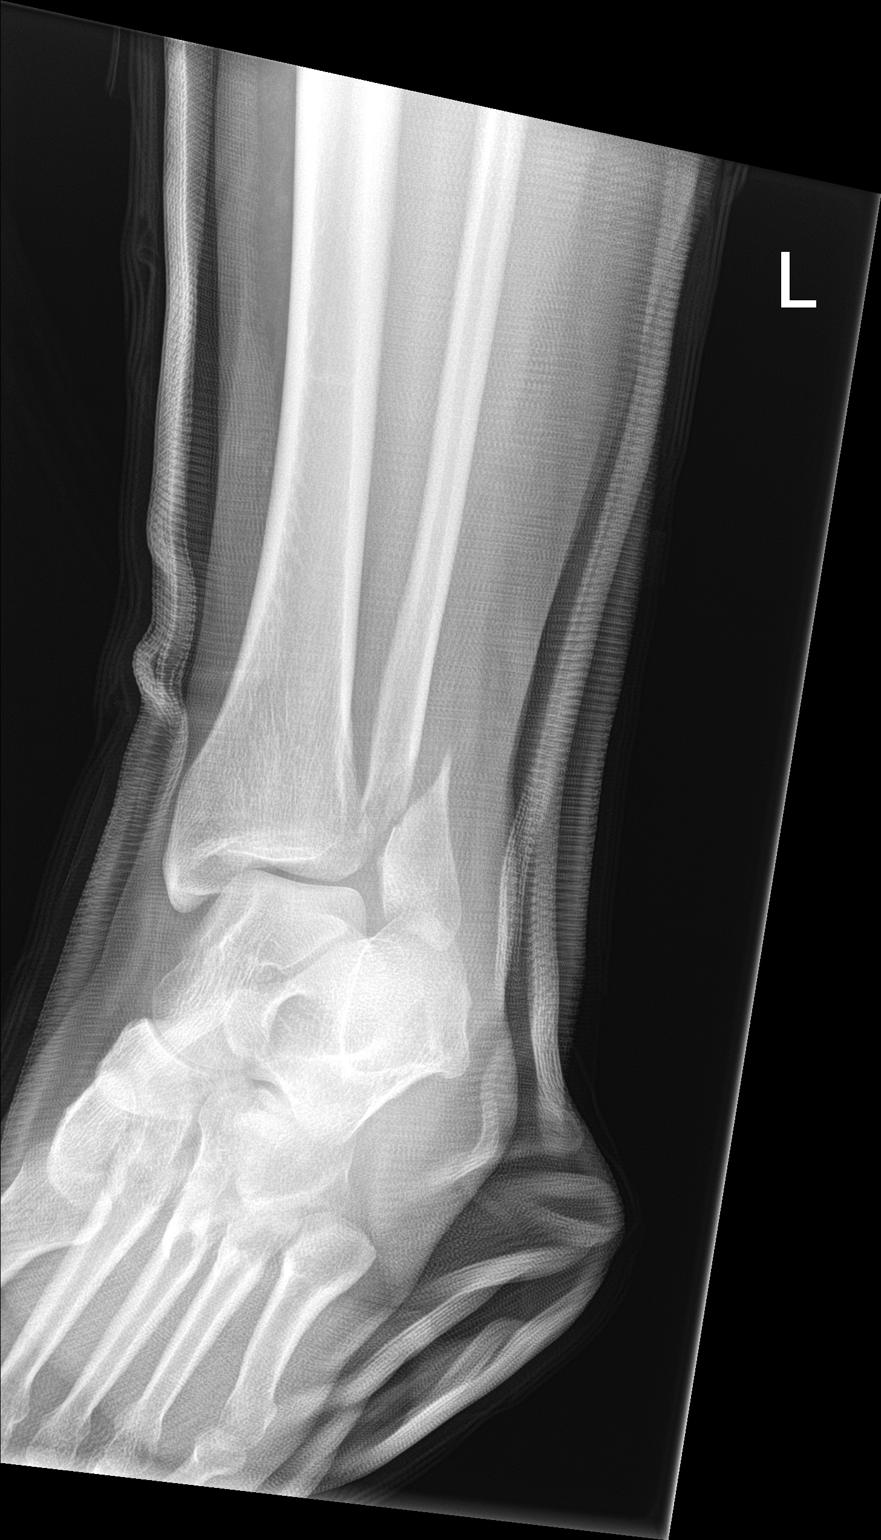

[ankle lat]
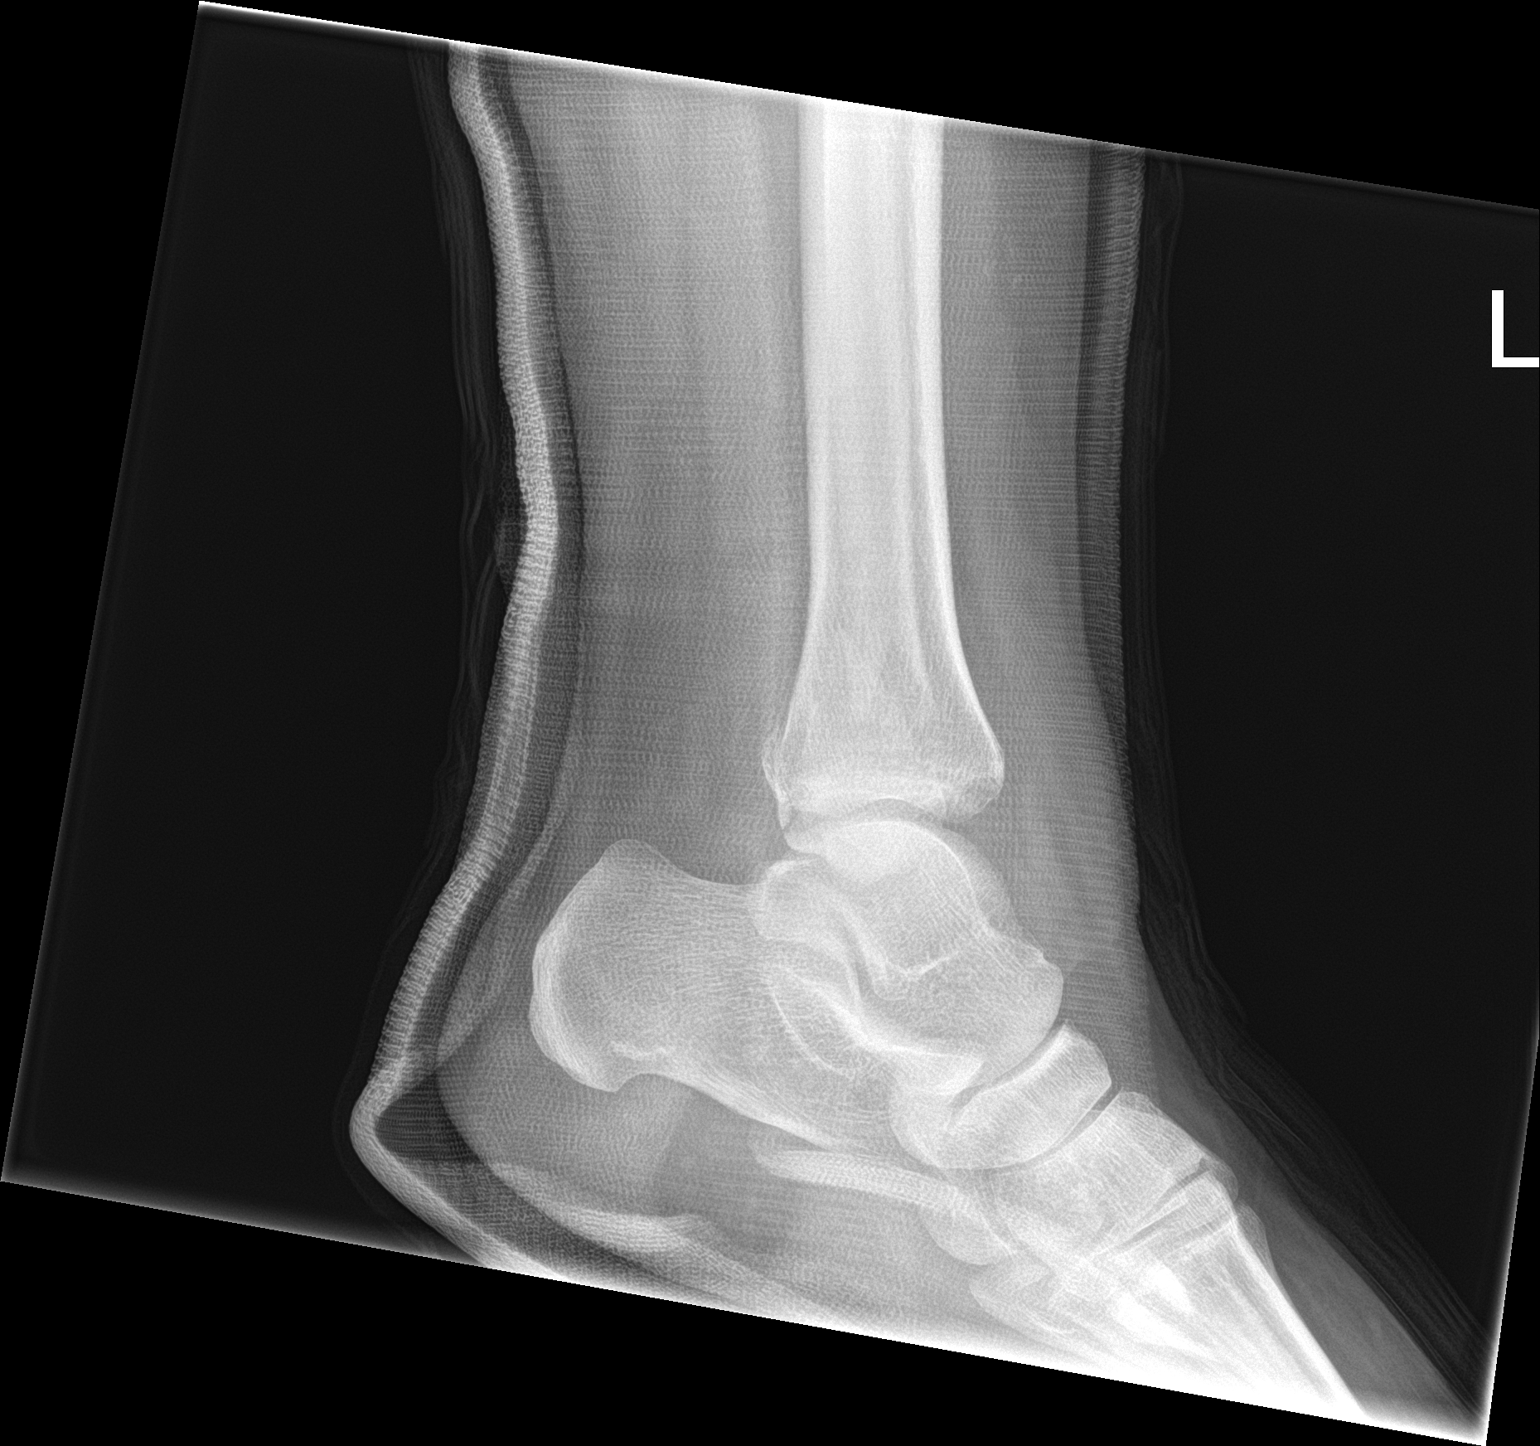

[3 of 3 positions shown; findings below may reference images not displayed]

FINDINGS: Reduced ankle joint on the lateral view. Improved alignment of the
distal fibular fracture, although there is still lateral subluxation
of the ankle and fracture. A splint has been applied.
IMPRESSION: 1. Significantly improved ankle joint alignment.
2. Distal fibular fracture with lateral displacement and subluxation
of the ankle.
